# Patient Record
Sex: Female | Born: 1945 | Race: White | Hispanic: No | Marital: Married | Smoking: Never smoker
Health system: Southern US, Community
[De-identification: ages and names within clinical notes are randomized; demographics above are authoritative.]

## PROBLEM LIST (undated history)

## (undated) DIAGNOSIS — I10 Essential (primary) hypertension: Secondary | ICD-10-CM

## (undated) DIAGNOSIS — E079 Disorder of thyroid, unspecified: Secondary | ICD-10-CM

## (undated) DIAGNOSIS — E785 Hyperlipidemia, unspecified: Secondary | ICD-10-CM

## (undated) DIAGNOSIS — K5792 Diverticulitis of intestine, part unspecified, without perforation or abscess without bleeding: Secondary | ICD-10-CM

## (undated) HISTORY — DX: Essential (primary) hypertension: I10

## (undated) HISTORY — PX: OTHER SURGICAL HISTORY: SHX169

## (undated) HISTORY — PX: COLONOSCOPY: SHX174

## (undated) HISTORY — DX: Disorder of thyroid, unspecified: E07.9

## (undated) HISTORY — DX: Diverticulitis of intestine, part unspecified, without perforation or abscess without bleeding: K57.92

## (undated) HISTORY — DX: Hyperlipidemia, unspecified: E78.5

## (undated) HISTORY — PX: DILATION AND CURETTAGE OF UTERUS: SHX78

---

## 1998-03-17 ENCOUNTER — Other Ambulatory Visit: Admission: RE | Admit: 1998-03-17 | Discharge: 1998-03-17 | Payer: Self-pay | Admitting: Obstetrics and Gynecology

## 1998-11-14 ENCOUNTER — Other Ambulatory Visit: Admission: RE | Admit: 1998-11-14 | Discharge: 1998-11-14 | Payer: Self-pay | Admitting: Obstetrics and Gynecology

## 1999-01-25 ENCOUNTER — Encounter: Payer: Self-pay | Admitting: Obstetrics and Gynecology

## 1999-01-25 ENCOUNTER — Ambulatory Visit (HOSPITAL_COMMUNITY): Admission: RE | Admit: 1999-01-25 | Discharge: 1999-01-25 | Payer: Self-pay | Admitting: Obstetrics and Gynecology

## 1999-10-13 ENCOUNTER — Ambulatory Visit (HOSPITAL_COMMUNITY): Admission: RE | Admit: 1999-10-13 | Discharge: 1999-10-13 | Payer: Self-pay | Admitting: Family Medicine

## 1999-10-13 ENCOUNTER — Encounter: Payer: Self-pay | Admitting: Family Medicine

## 1999-11-28 ENCOUNTER — Encounter: Payer: Self-pay | Admitting: Orthopedic Surgery

## 1999-11-28 ENCOUNTER — Encounter: Admission: RE | Admit: 1999-11-28 | Discharge: 1999-11-28 | Payer: Self-pay | Admitting: Orthopedic Surgery

## 1999-12-05 ENCOUNTER — Other Ambulatory Visit: Admission: RE | Admit: 1999-12-05 | Discharge: 1999-12-05 | Payer: Self-pay | Admitting: Obstetrics and Gynecology

## 2000-10-02 ENCOUNTER — Encounter: Admission: RE | Admit: 2000-10-02 | Discharge: 2000-10-02 | Payer: Self-pay | Admitting: Family Medicine

## 2000-10-02 ENCOUNTER — Encounter: Payer: Self-pay | Admitting: Family Medicine

## 2000-10-14 ENCOUNTER — Ambulatory Visit (HOSPITAL_COMMUNITY): Admission: RE | Admit: 2000-10-14 | Discharge: 2000-10-15 | Payer: Self-pay | Admitting: Neurosurgery

## 2000-10-14 ENCOUNTER — Encounter: Payer: Self-pay | Admitting: Neurosurgery

## 2000-10-24 ENCOUNTER — Encounter: Payer: Self-pay | Admitting: Family Medicine

## 2000-10-24 ENCOUNTER — Ambulatory Visit (HOSPITAL_COMMUNITY): Admission: RE | Admit: 2000-10-24 | Discharge: 2000-10-24 | Payer: Self-pay | Admitting: Family Medicine

## 2000-12-03 ENCOUNTER — Encounter: Payer: Self-pay | Admitting: Neurosurgery

## 2000-12-03 ENCOUNTER — Ambulatory Visit (HOSPITAL_COMMUNITY): Admission: RE | Admit: 2000-12-03 | Discharge: 2000-12-03 | Payer: Self-pay | Admitting: Neurosurgery

## 2000-12-09 ENCOUNTER — Other Ambulatory Visit: Admission: RE | Admit: 2000-12-09 | Discharge: 2000-12-09 | Payer: Self-pay | Admitting: Obstetrics and Gynecology

## 2000-12-13 ENCOUNTER — Ambulatory Visit (HOSPITAL_COMMUNITY): Admission: RE | Admit: 2000-12-13 | Discharge: 2000-12-14 | Payer: Self-pay

## 2001-12-23 ENCOUNTER — Other Ambulatory Visit: Admission: RE | Admit: 2001-12-23 | Discharge: 2001-12-23 | Payer: Self-pay | Admitting: Obstetrics and Gynecology

## 2003-02-22 ENCOUNTER — Other Ambulatory Visit: Admission: RE | Admit: 2003-02-22 | Discharge: 2003-02-22 | Payer: Self-pay | Admitting: Obstetrics and Gynecology

## 2003-08-13 ENCOUNTER — Ambulatory Visit (HOSPITAL_COMMUNITY): Admission: RE | Admit: 2003-08-13 | Discharge: 2003-08-13 | Payer: Self-pay | Admitting: Family Medicine

## 2004-07-06 ENCOUNTER — Other Ambulatory Visit: Admission: RE | Admit: 2004-07-06 | Discharge: 2004-07-06 | Payer: Self-pay | Admitting: *Deleted

## 2005-08-31 ENCOUNTER — Other Ambulatory Visit: Admission: RE | Admit: 2005-08-31 | Discharge: 2005-08-31 | Payer: Self-pay | Admitting: Obstetrics and Gynecology

## 2008-12-24 ENCOUNTER — Ambulatory Visit: Payer: Self-pay | Admitting: Cardiology

## 2008-12-24 ENCOUNTER — Encounter: Payer: Self-pay | Admitting: Cardiology

## 2008-12-24 DIAGNOSIS — E782 Mixed hyperlipidemia: Secondary | ICD-10-CM

## 2008-12-24 LAB — CONVERTED CEMR LAB: CRP, High Sensitivity: <1 (ref 0.00–5.00)

## 2010-03-06 ENCOUNTER — Ambulatory Visit (HOSPITAL_COMMUNITY): Admission: RE | Admit: 2010-03-06 | Discharge: 2010-03-07 | Payer: Self-pay | Admitting: Neurosurgery

## 2010-12-18 LAB — CBC
HCT: 37.4 % (ref 36.0–46.0)
Hemoglobin: 12.8 g/dL (ref 12.0–15.0)
MCHC: 34.3 g/dL (ref 30.0–36.0)
MCV: 89.3 fL (ref 78.0–100.0)
Platelets: 211 10*3/uL (ref 150–400)
RBC: 4.19 MIL/uL (ref 3.87–5.11)
RDW: 13.1 % (ref 11.5–15.5)
WBC: 8.8 10*3/uL (ref 4.0–10.5)

## 2010-12-18 LAB — SURGICAL PCR SCREEN
MRSA, PCR: NEGATIVE
Staphylococcus aureus: NEGATIVE

## 2011-02-16 NOTE — Op Note (Signed)
[place]  Patient:    Meghan Yang, Meghan Yang                      MRN: [id number] Proc. Date: 10/14/00 Attending:  Julio Sicks, M.D.                           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  OPERATION/PROCEDURE: C6-7 anterior cervical diskectomy and fusion with allograft.  SURGEON: Julio Sicks, M.D.  ASSISTANT: Donzetta Sprung. Roney Jaffe., M.D.  ANESTHESIA: General endotracheal.  INDICATIONS FOR PROCEDURE: Meghan Yang is a 65 year old female with a history of neck and right upper extremity pain and weakness consistent with right-sided C7 radiculopathy.  She failed conservative management and MRI scan demonstrated a large right-sided C6-7 disk herniation with compression of the right-sided C7 nerve root.  As the patient has failed conservative management we discussed options and she decided to proceed with C6-7 anterior cervical diskectomy and fusion with allograft to hopefully relieve her symptoms.  DESCRIPTION OF PROCEDURE: The patient was taken to the operating room and placed on the operating room table in the supine position and after an adequate level of anesthesia was achieved the patient was positioned supine with her neck slightly extended and placed in Halter traction.  The anterior cervical region was shaved and prepped sterilely and a skin incision made overlying the C6-7 interspace.  This was carried down sharply to the platysma and the platysma was then divided vertically and dissection proceeded along the medial border of the sternocleidomastoid muscle and carotid sheath on the right side.  The trachea and esophagus were mobilized and retracted toward the left.  Prevertebral fascia was stripped on the anterior side and the longus colli muscles were then elevated bilaterally using electrocautery.  A deep self-retaining retractor  was placed and intraoperative x-ray views of the C6-7 level confirmed.  The disk space was incised with a 15 blade in regular fashion and and the space cleaned out with pituitary rongeurs.  Forward and backward angled cartilage curets, Kerrison ronguers, and the high speed drill. All disc was removed along the posterior annulus.  The operating microscope was brought onto the field and used throughout the remainder of the diskectomy.  The remaining aspects of the annulus and osteophytes were removed using the high speed drill and Kerrison rongeurs down to the level of the posterior margin.  The posterior margin was elevated and resected in piecemeal fashion and cleared of underlying thecal sac.  Decompression then proceeded out to the left side at C7 and the C7 nerve root on this side was found to be free of any compression.  Decompression then proceeded out to the right side at the C7 foramen.  Along the way a large amount of subligamentous disk herniation was encountered and this was completely resected.  Decompression then proceeded distally to the right side of C7 foramen.  The C7 nerve root was identified and found to be well decompressed.  There was no evidence of any residual disk herniation.  The wound was then copiously irrigated with antibiotic solution and Gelfoam was placed for hemostasis.  Hemostasis was shown to be good.  The disk space was then distracted and a 6 mm fusion allograft impacted into place, recessed approximately 2 mm from the anterior cortical surface.  The retractor system and the operative microscope were removed.  Hemostasis  was achieved with bipolar electrocautery.  Final x-ray images revealed good position of the bone graft at the proper operative level with normal alignment of the spine.  The wound was inspected for hemostasis one final time and was found to be good.  The platysma was reapproximated with 3-0 Vicryl suture and the skin reapproximated with 5-0  PDS in inverted subcuticular fashion.  Steri-Strips and a sterile dressing were applied. There were no apparent complications.  The patient tolerated the procedure well and was taken to the recovery room. DD:  10/14/00 TD:  10/15/00 Job: [id number]

## 2011-02-16 NOTE — Op Note (Signed)
[place]  Patient:    Meghan Yang, Meghan Yang                    MRN: [id number] Proc. Date: 12/13/00 Adm. Date:  [id number] Attending:  Gennie Alma CC:         Chales Salmon. Abigail Miyamoto, M.D.   Operative Report  CCS [phone].  PREOPERATIVE DIAGNOSIS:  Mass of right lobe of the thyroid.  POSTOPERATIVE DIAGNOSIS:  Follicular adenomata (two), right lobe of thyroid.  PROCEDURE:  Right thyroid lobectomy and isthmusectomy.  SURGEON:  Milus Mallick, M.D.  ASSISTANT:  Velora Heckler, M.D.  ANESTHESIA:  General endotracheal.  DESCRIPTION OF PROCEDURE:  Under adequate general endotracheal anesthesia, the patients neck was extended over a shoulder roll, and that was actually done prior to instituting the general anesthetic because the patient had had recent cervical spine surgery and we wanted to be certain that she was having no discomfort.  The neck was prepared and draped in the usual fashion.  There was a healing scar of the right side of her neck transversely that was approximately 2-1/2 fingerbreadths above the sternal notch.  An incision was made in the scar on the right side and carried out to the left side in a skin line.  Bleeders were electrocoagulated.  The platysma muscle was divided. Superior and inferior platysmal flaps were then developed using Bovie electrocoagulation.  No cervical fascia was exposed.  A Mahorner self-retaining retractor was inserted.  The middle cervical fascia was incised longitudinally in the midline.  The right-sided strap muscles were dissected off of the right lobe of the thyroid.  There was a large dominant mass in the central to upper lobe.  By blunt and sharp dissection, the right lobe was separated from the carotid sheath and surrounding tissue.  The right superior pole vessels were exposed.  They were controlled with a ligature of 2-0 silk and a clip on the stay side and a ligature of 2-0 silk on the  specimen side. Next, the inferior pole vessels were divided over hemostats, which were ligated with 3-0 silk.  The right inferior parathyroid gland was identified and spared of any injury in this dissection.  The right lobe of the thyroid was then easier to retract medially and rotate anteriorly.  Branches of the inferior thyroid artery were taken over small hemoclips, and care was taken not to injure the right recurrent laryngeal nerve.  The ligament of Allyson Sabal was divided over a medium clip.  The attachments of the lobe to the trachea were then divided with Bovie electrocoagulation, and the specimen was elevated out of the right side of the neck and was attached only by the isthmus.  The isthmus was divided in its junction with the left lobe of the thyroid over two hemostats, which were secured with 4-0 Vicryl suture ligatures.  The specimen was sent for frozen section study, which suggested that there were two nodules in the lobe that were follicular lesions but had a benign appearance.  Hemostasis was ascertained.  The strap muscles were repaired in the midline with a continuous suture of 3-0 Vicryl.  The platysma layer was reapproximated with interrupted sutures of 4-0 Vicryl.  The skin was approximated with a generic skin stapler, 35-W, and a sterile dressing was applied.  Estimated blood loss for the procedure was negligible.  The patient tolerated the procedure well and left the operating room in satisfactory condition. DD:  12/13/00 TD:  12/13/00  Job: [id number]

## 2011-03-27 ENCOUNTER — Encounter: Payer: Self-pay | Admitting: Cardiology

## 2015-03-02 DEATH — deceased

## 2016-04-11 ENCOUNTER — Encounter: Payer: Self-pay | Admitting: Gastroenterology

## 2016-06-05 ENCOUNTER — Encounter (INDEPENDENT_AMBULATORY_CARE_PROVIDER_SITE_OTHER): Payer: Self-pay

## 2016-06-05 ENCOUNTER — Ambulatory Visit (AMBULATORY_SURGERY_CENTER): Payer: Medicare Other | Admitting: *Deleted

## 2016-06-05 VITALS — Ht 59.0 in | Wt 157.0 lb

## 2016-06-05 DIAGNOSIS — Z1211 Encounter for screening for malignant neoplasm of colon: Secondary | ICD-10-CM

## 2016-06-05 MED ORDER — NA SULFATE-K SULFATE-MG SULF 17.5-3.13-1.6 GM/177ML PO SOLN: 1.0000 | Freq: Once | ORAL | 0 refills | Status: AC

## 2016-06-05 NOTE — Progress Notes (Signed)
No egg or soy allergy known to patient  No issues with past sedation with any surgeries  or procedures, no intubation problems - 30 yrs ago with d and c was over sedated and had burning sensation in chest but no issues since with sedation  No diet pills per patient No home 02 use per patient  No blood thinners per patient  Pt denies issues with constipation - takes metamucil daily with regularity - goes daily and softer  No A fib or A flutter

## 2016-06-06 ENCOUNTER — Encounter: Payer: Self-pay | Admitting: Gastroenterology

## 2016-06-11 ENCOUNTER — Other Ambulatory Visit: Payer: Self-pay | Admitting: Internal Medicine

## 2016-06-11 ENCOUNTER — Ambulatory Visit
Admission: RE | Admit: 2016-06-11 | Discharge: 2016-06-11 | Disposition: A | Discharge status: Home / Self Care | Payer: Medicare Other | Source: Ambulatory Visit | Attending: Internal Medicine | Admitting: Internal Medicine

## 2016-06-11 DIAGNOSIS — R10814 Left lower quadrant abdominal tenderness: Secondary | ICD-10-CM

## 2016-06-11 DIAGNOSIS — R1011 Right upper quadrant pain: Secondary | ICD-10-CM

## 2016-06-11 MED ORDER — IOPAMIDOL (ISOVUE-300) INJECTION 61%
100.0000 mL | Freq: Once | INTRAVENOUS | Status: AC | PRN
  Administered 2016-06-11: 100 mL via INTRAVENOUS

## 2016-06-12 ENCOUNTER — Telehealth: Payer: Self-pay | Admitting: Gastroenterology

## 2016-06-12 NOTE — Telephone Encounter (Signed)
She has small bowel acute diverticulitis which is very rare, will need to complete the course of antibiotics. Please arrange for patient to follow up in 2 weeks, ok to schedule with APP and we should obtain CT enterography with contrast in 4- 6 weeks to exclude any underlying pathology

## 2016-06-12 NOTE — Telephone Encounter (Signed)
The CT report is in EPIC. Her PCP called her. She is on Flagyl and Cipro. The PCP told her the CT was "interesting" and he would like her to be seen soon. I can put her with an APP if you feel it is urgent. She rescheduled her colonoscopy to November. It is a screening colon. Please advise on office appointment.

## 2016-06-13 NOTE — Telephone Encounter (Signed)
Left a message on her voicemail about this and offering an appointment with the doctor on 06/28/16. Her husband has colon cancer and has to be at [facility] today. When I spoke with her yesterday, she had asked me to leave her a message.

## 2016-06-13 NOTE — Telephone Encounter (Signed)
Patient calls back. She has spoken with Dr Joylene Draft. She thinks she has swelling on her left side of the abdomen. Appointment made for 06/21/16. Patient states Dr Joylene Draft wants her seen in a week. She doesn't want to wait until 06/28/16.

## 2016-06-18 ENCOUNTER — Encounter: Payer: Self-pay | Admitting: Gastroenterology

## 2016-06-21 ENCOUNTER — Ambulatory Visit (INDEPENDENT_AMBULATORY_CARE_PROVIDER_SITE_OTHER): Payer: Medicare Other | Admitting: Gastroenterology

## 2016-06-21 ENCOUNTER — Encounter (INDEPENDENT_AMBULATORY_CARE_PROVIDER_SITE_OTHER): Payer: Self-pay

## 2016-06-21 ENCOUNTER — Other Ambulatory Visit (INDEPENDENT_AMBULATORY_CARE_PROVIDER_SITE_OTHER): Payer: Medicare Other

## 2016-06-21 ENCOUNTER — Encounter: Payer: Self-pay | Admitting: Gastroenterology

## 2016-06-21 VITALS — BP 118/74 | HR 92 | Ht 59.0 in | Wt 154.6 lb

## 2016-06-21 DIAGNOSIS — R140 Abdominal distension (gaseous): Secondary | ICD-10-CM | POA: Diagnosis not present

## 2016-06-21 DIAGNOSIS — K5792 Diverticulitis of intestine, part unspecified, without perforation or abscess without bleeding: Secondary | ICD-10-CM | POA: Diagnosis not present

## 2016-06-21 DIAGNOSIS — R110 Nausea: Secondary | ICD-10-CM | POA: Diagnosis not present

## 2016-06-21 DIAGNOSIS — K5732 Diverticulitis of large intestine without perforation or abscess without bleeding: Secondary | ICD-10-CM

## 2016-06-21 DIAGNOSIS — IMO0002 Reserved for concepts with insufficient information to code with codable children: Secondary | ICD-10-CM

## 2016-06-21 LAB — BASIC METABOLIC PANEL
BUN: 11 mg/dL (ref 6–23)
CO2: 32 mEq/L (ref 19–32)
Calcium: 9 mg/dL (ref 8.4–10.5)
Chloride: 106 mEq/L (ref 96–112)
Creatinine, Ser: 0.7 mg/dL (ref 0.40–1.20)
GFR: 87.66 mL/min (ref >60.00)
Glucose, Bld: 80 mg/dL (ref 70–99)
Potassium: 4.3 mEq/L (ref 3.5–5.1)
Sodium: 142 mEq/L (ref 135–145)

## 2016-06-21 MED ORDER — ONDANSETRON HCL 4 MG PO TABS: 4.0000 mg | ORAL_TABLET | Freq: Two times a day (BID) | ORAL | 0 refills | Status: AC

## 2016-06-21 NOTE — Patient Instructions (Addendum)
You have been scheduled for a CT scan of the abdomen and pelvis at [facility] ([address]---this is in the same building as [facility]).   You are scheduled on 07/23/2016 at 11am arrive at 9:30am. To drink contrast  WARNING: IF YOU ARE ALLERGIC TO IODINE/X-RAY DYE, PLEASE NOTIFY RADIOLOGY IMMEDIATELY AT [phone]! YOU WILL BE GIVEN A 13 HOUR PREMEDICATION PREP.  1) Do not eat or drink anything after 7:00am (4 hours prior to your test) 2) You have been given 2 bottles of oral contrast to drink. The solution may taste               better if refrigerated, but do NOT add ice or any other liquid to this solution. Shake             well before drinking.    You may take any medications as prescribed with a small amount of water except for the following: Metformin, Glucophage, Glucovance, Avandamet, Riomet, Fortamet, Actoplus Met, Janumet, Glumetza or Metaglip. The above medications must be held the day of the exam AND 48 hours after the exam.  The purpose of you drinking the oral contrast is to aid in the visualization of your intestinal tract. The contrast solution may cause some diarrhea. Before your exam is started, you will be given a small amount of fluid to drink. Depending on your individual set of symptoms, you may also receive an intravenous injection of x-ray contrast/dye. Plan on being at [facility] for 30 minutes or longer, depending on the type of exam you are having performed.  This test typically takes 30-45 minutes to complete.  If you have any questions regarding your exam or if you need to reschedule, you may call the CT department at [phone] between the hours of 8:00 am and 5:00 pm, Monday-Friday.  ________________________________________________________________________   We have sent Zofran to your pharmacy  Go to the basement today for labs  (BMET)  Use VSL # 3 units daily as needed purchased over the counter at your pharmacy  Take  Benefiber daily three times a day with meals  Use over the counter Miralax 1/2 capful daily then reduce as needed   Use Candy Ginger as needed for nausea

## 2016-06-21 NOTE — Progress Notes (Signed)
Meghan Yang    CF:[id number]    March 17, 1946  Primary Care Physician:PERINI,MARK A, MD  Referring Physician: Crist Infante, MD [address] [place], [place] [address]  Chief complaint:  Acute small bowel diverticulitis  HPI: 70 year old female here for visit after recent diagnosis of acute small bowel diverticulitis 10 days ago, she developed severe abdominal pain in the mid abdomen on the right side associated with fever and chills, Dr. Joylene Draft obtained CT abdomen and pelvis that showed evidence of small bowel diverticulitis of 3 cm diverticula just off the proximal small bowel, likely proximal to mid jejunal loop. She is on Cipro and Flagyl date 12/14. Abdominal pain has significantly improved and she is slowly trying to advance her diet .She is having severe nausea with antibiotics and decreased appetite . She has history of chronic intermittent constipation that has improved with daily Metamucil but complains of increased bloating in the upper abdomen. Denies any  vomiting,  melena or bright red blood per rectum    Outpatient Encounter Prescriptions as of 06/21/2016  Medication Sig  . atorvastatin (LIPITOR) 10 MG tablet Take 10 mg by mouth daily.  . Calcium Carbonate-Vitamin D (CALTRATE 600+D) 600-400 MG-UNIT per tablet Take 1 tablet by mouth daily.    . ciprofloxacin (CIPRO) 500 MG tablet   . glucosamine-chondroitin 500-400 MG tablet Take 1 tablet by mouth daily.  Marland Kitchen levothyroxine (SYNTHROID, LEVOTHROID) 75 MCG tablet Take 75 mcg by mouth daily.    . metroNIDAZOLE (FLAGYL) 500 MG tablet   . Omega-3 Fatty Acids (FISH OIL) 1200 MG CAPS Take 1 capsule by mouth daily.    Marland Kitchen omeprazole (PRILOSEC) 20 MG capsule Take 20 mg by mouth daily.  Marland Kitchen OVER THE COUNTER MEDICATION Take 2 capsules by mouth daily. Metamucil  . Probiotic Product ([place]) CAPS Take by mouth.  . triamcinolone (NASACORT AQ) 55 MCG/ACT nasal inhaler Place 2 sprays into the nose daily.    . valsartan  (DIOVAN) 80 MG tablet Take 80 mg by mouth daily.  . ondansetron (ZOFRAN) 4 MG tablet Take 1 tablet (4 mg total) by mouth 2 (two) times daily. As needed  . [DISCONTINUED] eszopiclone (LUNESTA) 2 MG TABS Take 2 mg by mouth at bedtime as needed. Take immediately before bedtime   . [DISCONTINUED] ranitidine (ZANTAC) 150 MG tablet Take 150 mg by mouth 2 (two) times daily.   No facility-administered encounter medications on file as of 06/21/2016.     Allergies as of 06/21/2016 - Review Complete 06/21/2016  Allergen Reaction Noted  . Penicillins Rash 12/24/2008    Past Medical History:  Diagnosis Date  . Diverticulitis   . HLD (hyperlipidemia)   . Hypertension   . Thyroid disease    right thyroid mass removed- benign    Past Surgical History:  Procedure Laterality Date  . COLONOSCOPY    . DILATION AND CURETTAGE OF UTERUS     30 yrs ago  . mass of right lobe on thyroid     postoperative diagonosis: follicular adenoata (two), right lobe of thyroid. 12/13/00  . right C6-7 herniated nucleus pilposus     10/14/00    Family History  Problem Relation Age of Onset  . Coronary artery disease Father     and sibilings   . Colon cancer Neg Hx   . Colon polyps Neg Hx   . Rectal cancer Neg Hx   . Stomach cancer Neg Hx     Social History  Social History  . Marital status: Married    Spouse name: N/A  . Number of children: N/A  . Years of education: N/A   Occupational History  . Not on file.   Social History Main Topics  . Smoking status: Never Smoker  . Smokeless tobacco: Never Used     Comment: tobacco use - no   . Alcohol use Yes     Comment: very rare   . Drug use: No  . Sexual activity: Not on file   Other Topics Concern  . Not on file   Social History Narrative   Retired, married, gets regular exercise.       Review of systems: Review of Systems  Constitutional: Negative for fever and chills.  HENT: Negative.   Eyes: Negative for blurred vision.  Respiratory:  Negative for cough, shortness of breath and wheezing.   Cardiovascular: Negative for chest pain and palpitations.  Gastrointestinal: as per HPI Genitourinary: Negative for dysuria, urgency, frequency and hematuria.  Musculoskeletal: Negative for myalgias, back pain and joint pain.  Skin: Negative for itching and rash.  Neurological: Negative for dizziness, tremors, focal weakness, seizures and loss of consciousness.  Endo/Heme/Allergies: Positive for seasonal allergies.  Psychiatric/Behavioral: Negative for depression, suicidal ideas and hallucinations.  All other systems reviewed and are negative.   Physical Exam: Vitals:   06/21/16 1020  BP: 118/74  Pulse: 92   Body mass index is 31.23 kg/m. Gen:      No acute distress HEENT:  EOMI, sclera anicteric Neck:     No masses; no thyromegaly Lungs:    Clear to auscultation bilaterally; normal respiratory effort CV:         Regular rate and rhythm; no murmurs Abd:      + bowel sounds; soft, non-tender; no palpable masses, no distension Ext:    No edema; adequate peripheral perfusion Skin:      Warm and dry; no rash Neuro: alert and oriented x 3 Psych: normal mood and affect  Data Reviewed: CT abdomen and pelvis with contrast 06/11/16 3 cm diverticulum off the proximal small bowel loop in the left abdomen with surrounding hazy inflammation compatible with acute small bowel diverticulitis.   Assessment and Plan/Recommendations:  70 year old female here for office visit after recent episode of acute small bowel diverticulitis, likely for diverticula in the proximal/mid jejunum, I reviewed the images of CT abdomen and pelvis, it may be in a region that may not be reached by push enteroscopy. Will obtain CT enterography with contrast to evaluate in 4-6 weeks to exclude any mass or obstructive lesion in the proximity of the diverticula  She is due for screening colonoscopy scheduled in November 2017 The risks and benefits as well as  alternatives of endoscopic procedure(s) have been discussed and reviewed. All questions answered. The patient agrees to proceed.  Chronic constipation: Start MiraLAX half capful daily and titrate as needed to have one to 2 soft bowel movements a day Benefiber 1 tablespoon 3 times a day with meals  Excessive bloating: Likely related to use of Metamucil and excessive fiber intake Advised patient to avoid excessive fiber  Start Probiotic VSL # 3 112 Billion Units daily after completion of her antibiotic course  Severe nausea in the setting of antibiotic use: Occasionally use Zofran as needed and can also try candy Ginger   45 minutes was spent face-to-face with the patient. Greater than 50% of the time used for counseling as well as treatment plan and follow-up. She had  multiple questions which were answered to her satisfaction  K. Denzil Magnuson , MD [phone] Mon-Fri 8a-5p [phone] after 5p, weekends, holidays  CC: Crist Infante, MD

## 2016-06-27 ENCOUNTER — Other Ambulatory Visit: Payer: Medicare Other

## 2016-07-23 ENCOUNTER — Ambulatory Visit (INDEPENDENT_AMBULATORY_CARE_PROVIDER_SITE_OTHER)
Admission: RE | Admit: 2016-07-23 | Discharge: 2016-07-23 | Disposition: A | Discharge status: Home / Self Care | Payer: Medicare Other | Source: Ambulatory Visit | Attending: Gastroenterology | Admitting: Gastroenterology

## 2016-07-23 DIAGNOSIS — IMO0002 Reserved for concepts with insufficient information to code with codable children: Secondary | ICD-10-CM

## 2016-07-23 DIAGNOSIS — K5792 Diverticulitis of intestine, part unspecified, without perforation or abscess without bleeding: Secondary | ICD-10-CM | POA: Diagnosis not present

## 2016-07-23 MED ORDER — IOPAMIDOL (ISOVUE-300) INJECTION 61%
100.0000 mL | Freq: Once | INTRAVENOUS | Status: AC | PRN
  Administered 2016-07-23: 100 mL via INTRAVENOUS

## 2016-07-26 ENCOUNTER — Telehealth: Payer: Self-pay | Admitting: Gastroenterology

## 2016-07-26 NOTE — Telephone Encounter (Signed)
Ok to go back to [employer], if she prefers it

## 2016-07-26 NOTE — Telephone Encounter (Signed)
Patient is pleased with her CT results and remains pain free. She is experiencing sluggish bowel movements. She feels a little constipated and attributes this to Benefiber. She thinks it is less effective than Metamucil. Is it okay to go back to using Metamucil instead of Miralax? Or increase Miralax?

## 2016-07-26 NOTE — Telephone Encounter (Signed)
Left message with the recommendation

## 2016-08-14 ENCOUNTER — Encounter: Payer: Self-pay | Admitting: Gastroenterology

## 2016-08-14 ENCOUNTER — Ambulatory Visit (AMBULATORY_SURGERY_CENTER): Payer: Medicare Other | Admitting: Gastroenterology

## 2016-08-14 VITALS — BP 129/65 | HR 65 | Temp 96.2°F | Resp 30 | Ht 59.0 in | Wt 154.0 lb

## 2016-08-14 DIAGNOSIS — Z1211 Encounter for screening for malignant neoplasm of colon: Secondary | ICD-10-CM

## 2016-08-14 DIAGNOSIS — Z1212 Encounter for screening for malignant neoplasm of rectum: Secondary | ICD-10-CM

## 2016-08-14 DIAGNOSIS — K5792 Diverticulitis of intestine, part unspecified, without perforation or abscess without bleeding: Secondary | ICD-10-CM

## 2016-08-14 MED ORDER — SODIUM CHLORIDE 0.9 % IV SOLN
500.0000 mL | INTRAVENOUS | Status: AC
Start: 2016-08-14 — End: ?

## 2016-08-14 NOTE — Patient Instructions (Signed)
YOU HAD AN ENDOSCOPIC PROCEDURE TODAY AT [facility]:   Refer to the procedure report that was given to you for any specific questions about what was found during the examination.  If the procedure report does not answer your questions, please call your gastroenterologist to clarify.  If you requested that your care partner not be given the details of your procedure findings, then the procedure report has been included in a sealed envelope for you to review at your convenience later.  YOU SHOULD EXPECT: Some feelings of bloating in the abdomen. Passage of more gas than usual.  Walking can help get rid of the air that was put into your GI tract during the procedure and reduce the bloating. If you had a lower endoscopy (such as a colonoscopy or flexible sigmoidoscopy) you may notice spotting of blood in your stool or on the toilet paper. If you underwent a bowel prep for your procedure, you may not have a normal bowel movement for a few days.  Please Note:  You might notice some irritation and congestion in your nose or some drainage.  This is from the oxygen used during your procedure.  There is no need for concern and it should clear up in a day or so.  SYMPTOMS TO REPORT IMMEDIATELY:   Following lower endoscopy (colonoscopy or flexible sigmoidoscopy):  Excessive amounts of blood in the stool  Significant tenderness or worsening of abdominal pains  Swelling of the abdomen that is new, acute  Fever of 100F or higher   Following upper endoscopy (EGD)  Vomiting of blood or coffee ground material  New chest pain or pain under the shoulder blades  Painful or persistently difficult swallowing  New shortness of breath  Fever of 100F or higher  Black, tarry-looking stools  For urgent or emergent issues, a gastroenterologist can be reached at any hour by calling [phone].   DIET:  We do recommend a small meal at first, but then you may proceed to your regular diet.  Drink  plenty of fluids but you should avoid alcoholic beverages for 24 hours.  ACTIVITY:  You should plan to take it easy for the rest of today and you should NOT DRIVE or use heavy machinery until tomorrow (because of the sedation medicines used during the test).    FOLLOW UP: Our staff will call the number listed on your records the next business day following your procedure to check on you and address any questions or concerns that you may have regarding the information given to you following your procedure. If we do not reach you, we will leave a message.  However, if you are feeling well and you are not experiencing any problems, there is no need to return our call.  We will assume that you have returned to your regular daily activities without incident.  If any biopsies were taken you will be contacted by phone or by letter within the next 1-3 weeks.  Please call us at [phone] if you have not heard about the biopsies in 3 weeks.    SIGNATURES/CONFIDENTIALITY: You and/or your care partner have signed paperwork which will be entered into your electronic medical record.  These signatures attest to the fact that that the information above on your After Visit Summary has been reviewed and is understood.  Full responsibility of the confidentiality of this discharge information lies with you and/or your care-partner.  Hemorrhoid and diverticulosis information given.

## 2016-08-14 NOTE — Progress Notes (Signed)
Spontaneous respirations throughout. VSS. Resting comfortably. To PACU on room air. Report to  Jane RN. 

## 2016-08-14 NOTE — Op Note (Addendum)
[facility] Patient Name: Meghan Yang Procedure Date: 08/14/2016 8:56 AM MRN: [id number] Endoscopist: Mauri Pole , MD Age: 70 Referring MD:  Date of Birth: 09/22/1946 Gender: Female Account #: [id number] Procedure:                Colonoscopy Indications:              Screening for colorectal malignant neoplasm Medicines:                Monitored Anesthesia Care Procedure:                Pre-Anesthesia Assessment:                           - Prior to the procedure, a History and Physical                            was performed, and patient medications and                            allergies were reviewed. The patient's tolerance of                            previous anesthesia was also reviewed. The risks                            and benefits of the procedure and the sedation                            options and risks were discussed with the patient.                            All questions were answered, and informed consent                            was obtained. Prior Anticoagulants: The patient has                            taken no previous anticoagulant or antiplatelet                            agents. ASA Grade Assessment: II - A patient with                            mild systemic disease. After reviewing the risks                            and benefits, the patient was deemed in                            satisfactory condition to undergo the procedure.                           After obtaining informed consent, the colonoscope  was passed under direct vision. Throughout the                            procedure, the patient's blood pressure, pulse, and                            oxygen saturations were monitored continuously. The                            Model CF-HQ190L [id number]) scope was introduced                            through the anus and advanced to the the cecum,                            identified  by appendiceal orifice and ileocecal                            valve. The colonoscopy was performed without                            difficulty. The patient tolerated the procedure                            well. The quality of the bowel preparation was                            excellent. The ileocecal valve, appendiceal                            orifice, and rectum were photographed. The quality                            of the bowel preparation was evaluated using the                            BBPS [facility] Bowel Preparation Scale) with scores                            of: Right Colon = 3, Transverse Colon = 3 and Left                            Colon = 3 (entire mucosa seen well with no residual                            staining, small fragments of stool or opaque                            liquid). The total BBPS score equals 9. Scope In: 9:22:21 AM Scope Out: 9:35:55 AM Scope Withdrawal Time: 0 hours 10 minutes 8 seconds  Total Procedure Duration: 0 hours 13 minutes 34 seconds  Findings:  The perianal and digital rectal examinations were                            normal.                           A few small and large-mouthed diverticula were                            found in the sigmoid colon.                           Non-bleeding internal hemorrhoids were found during                            retroflexion. The hemorrhoids were medium-sized.                           The exam was otherwise without abnormality. Complications:            No immediate complications. Estimated Blood Loss:     Estimated blood loss: none. Impression:               - Diverticulosis in the sigmoid colon.                           - Non-bleeding internal hemorrhoids.                           - The examination was otherwise normal.                           - No specimens collected. Recommendation:           - Patient has a contact number available for                             emergencies. The signs and symptoms of potential                            delayed complications were discussed with the                            patient. Return to normal activities tomorrow.                            Written discharge instructions were provided to the                            patient.                           - Resume previous diet.                           - Continue present medications.                           -  Repeat colonoscopy in 10 years for screening                            purposes.                           - Return to GI clinic PRN. Mauri Pole, MD 08/14/2016 9:39:28 AM This report has been signed electronically.

## 2016-08-15 ENCOUNTER — Telehealth: Payer: Self-pay

## 2016-08-15 NOTE — Telephone Encounter (Signed)
  Follow up Call-  Call back number 08/14/2016  Post procedure Call Back phone  # [phone]  Permission to leave phone message Yes  Some recent data might be hidden     Patient questions:  Do you have a fever, pain , or abdominal swelling? No. Pain Score  0 *  Have you tolerated food without any problems? Yes.    Have you been able to return to your normal activities? Yes.    Do you have any questions about your discharge instructions: Diet   No. Medications  No. Follow up visit  No.  Do you have questions or concerns about your Care? No.  Actions: * If pain score is 4 or above: No action needed, pain <4.

## 2018-03-11 ENCOUNTER — Other Ambulatory Visit: Payer: Self-pay | Admitting: Obstetrics and Gynecology

## 2018-03-11 DIAGNOSIS — R928 Other abnormal and inconclusive findings on diagnostic imaging of breast: Secondary | ICD-10-CM

## 2018-03-18 ENCOUNTER — Ambulatory Visit
Admission: RE | Admit: 2018-03-18 | Discharge: 2018-03-18 | Disposition: A | Discharge status: Home / Self Care | Payer: Medicare Other | Source: Ambulatory Visit | Attending: Obstetrics and Gynecology | Admitting: Obstetrics and Gynecology

## 2018-03-18 ENCOUNTER — Ambulatory Visit: Payer: Medicare Other

## 2018-03-18 DIAGNOSIS — R928 Other abnormal and inconclusive findings on diagnostic imaging of breast: Secondary | ICD-10-CM

## 2018-06-26 ENCOUNTER — Other Ambulatory Visit: Payer: Self-pay | Admitting: Internal Medicine

## 2018-06-26 DIAGNOSIS — E785 Hyperlipidemia, unspecified: Secondary | ICD-10-CM

## 2018-07-03 ENCOUNTER — Ambulatory Visit
Admission: RE | Admit: 2018-07-03 | Discharge: 2018-07-03 | Disposition: A | Discharge status: Home / Self Care | Payer: Medicare Other | Source: Ambulatory Visit | Attending: Internal Medicine | Admitting: Internal Medicine

## 2018-07-03 DIAGNOSIS — E785 Hyperlipidemia, unspecified: Secondary | ICD-10-CM

## 2019-06-17 IMAGING — CT CT HEART SCORING
3 series · 13 of 20 positions shown, 15 images · non-contrast
Comparison: None.

EXAM:
CT HEART FOR CALCIUM SCORING
TECHNIQUE: CT heart was performed on a 256 channel system using prospective ECG
gating. A scout and noncontrast exam (for calcium scoring) were
performed. Note that this exam targets the heart and the chest was
not imaged in its entirety.

[Series 2: calcium scoring 2.00 qr36 bestdiast 69% · axial · 0.38mm/px · z∈[+1612,+1668]mm · 3 of 70 slices shown]
[im 14/70  vessel]
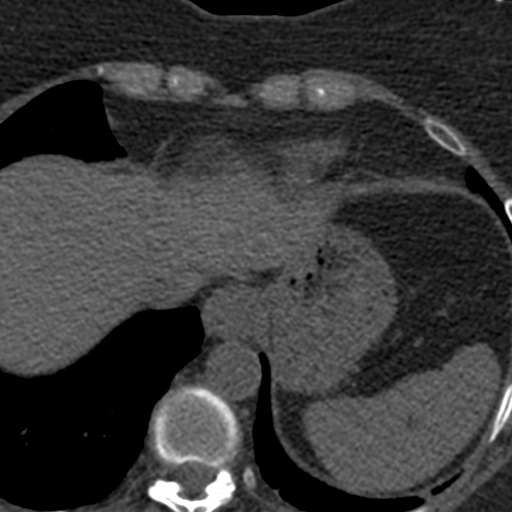
[im 28/70  vessel]
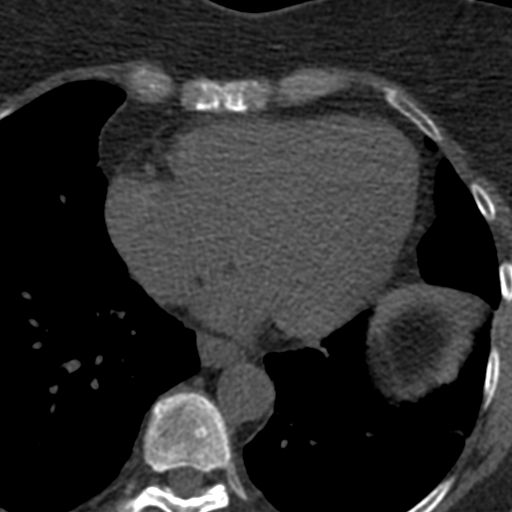
[im 42/70  vessel]
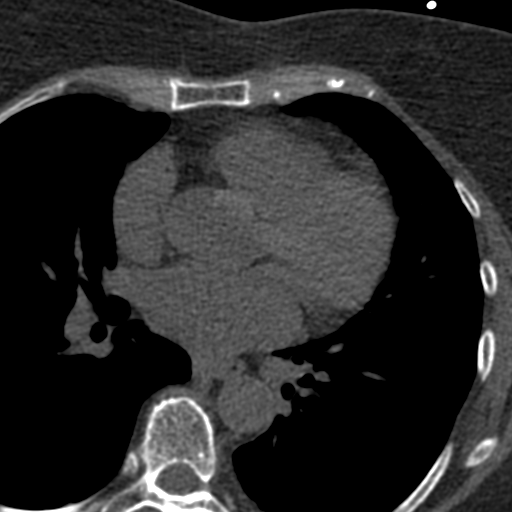

[Series 3: calcium scoring 2.00 br40 bestdiast 69% ax fov · axial · 0.52mm/px · z∈[+1608,+1700]mm · 5 of 70 slices shown, 7 images]
[im 12/70  vessel]
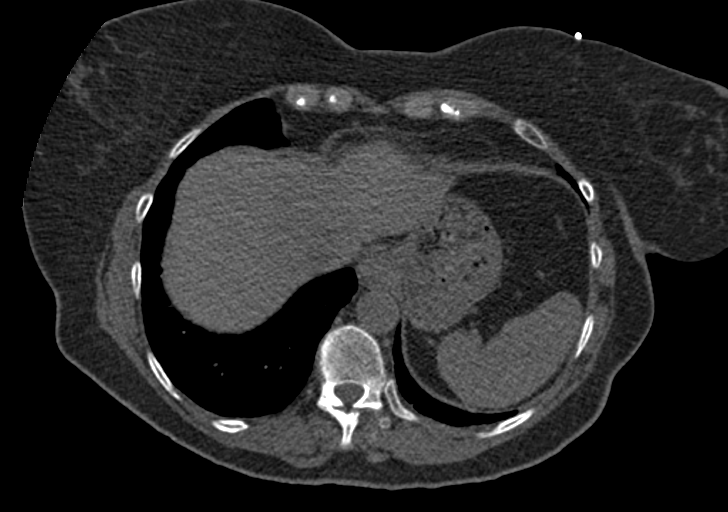
[im 12/70  lung]
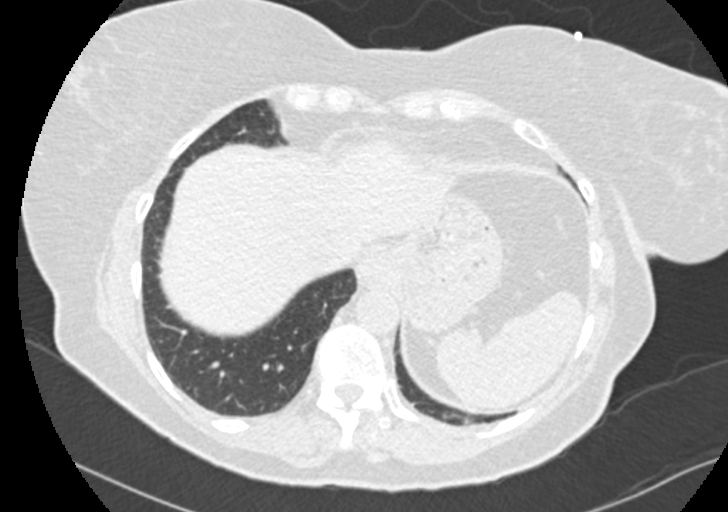
[im 24/70  vessel]
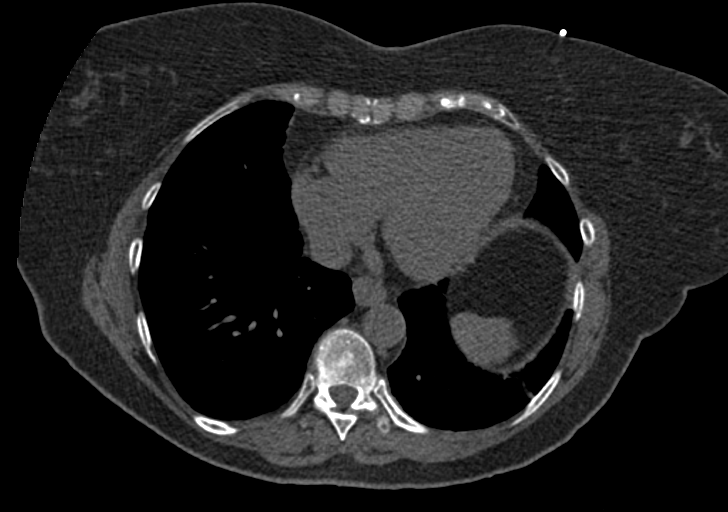
[im 35/70  vessel]
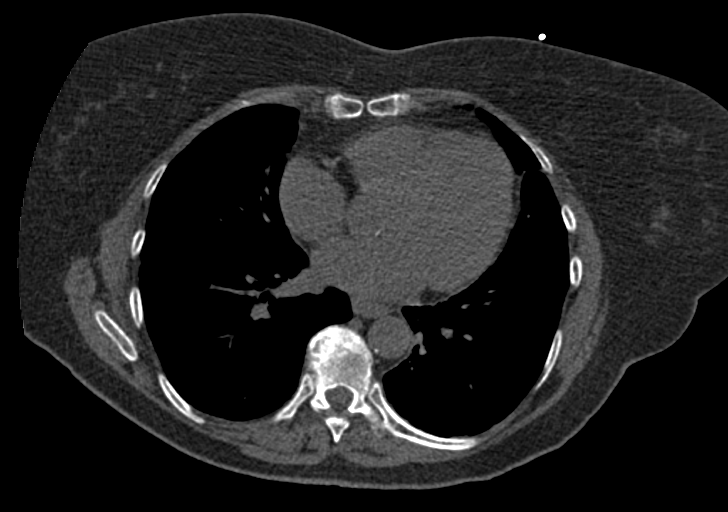
[im 47/70  vessel]
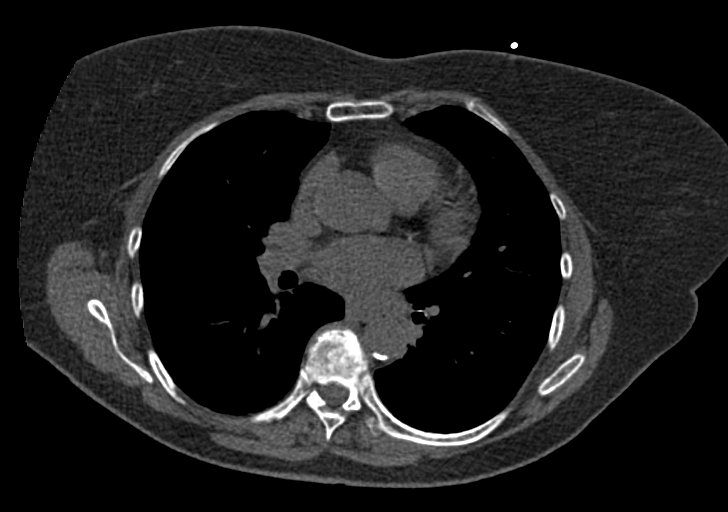
[im 58/70  vessel]
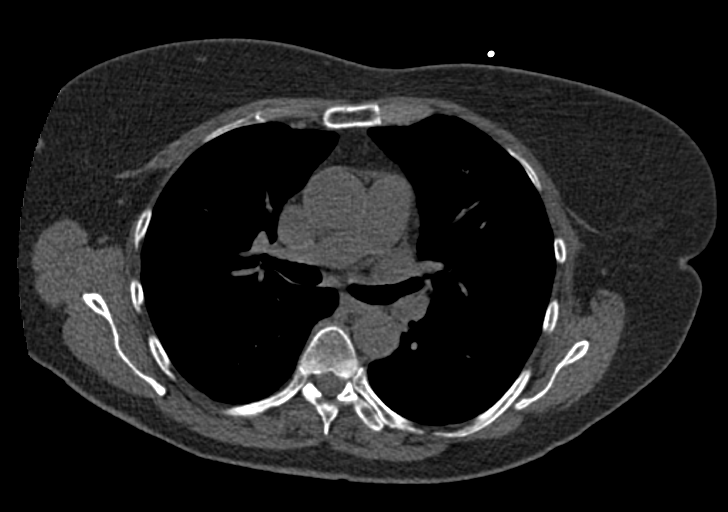
[im 58/70  lung]
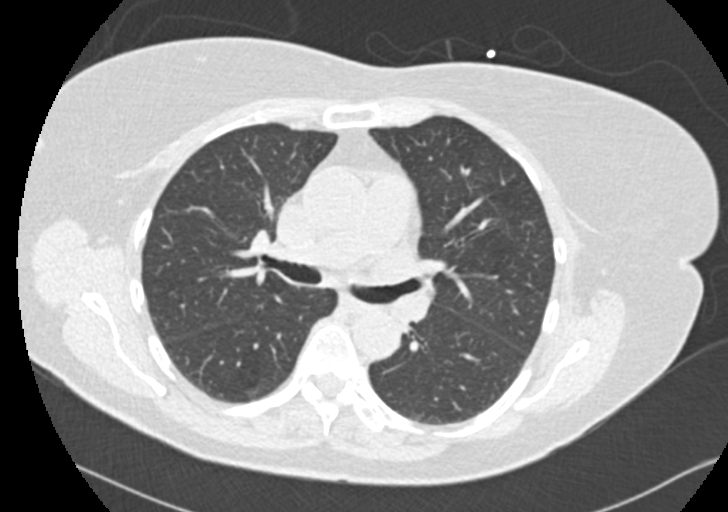

[Series 9: calcium scoring 2.00 br60 bestdiast 69% ax fov · axial · 0.52mm/px · z∈[+1608,+1700]mm · 5 of 70 slices shown]
[im 12/70  vessel]
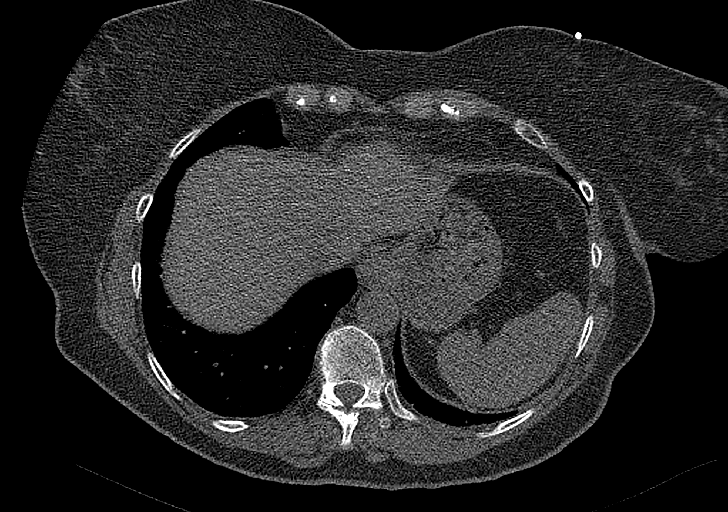
[im 24/70  vessel]
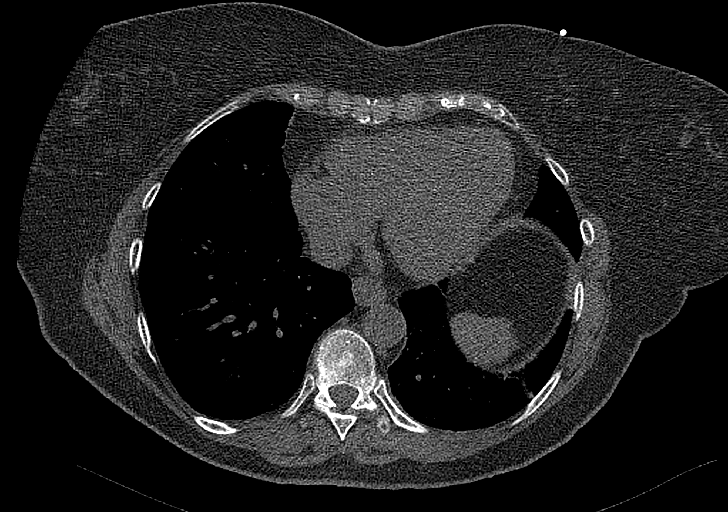
[im 35/70  vessel]
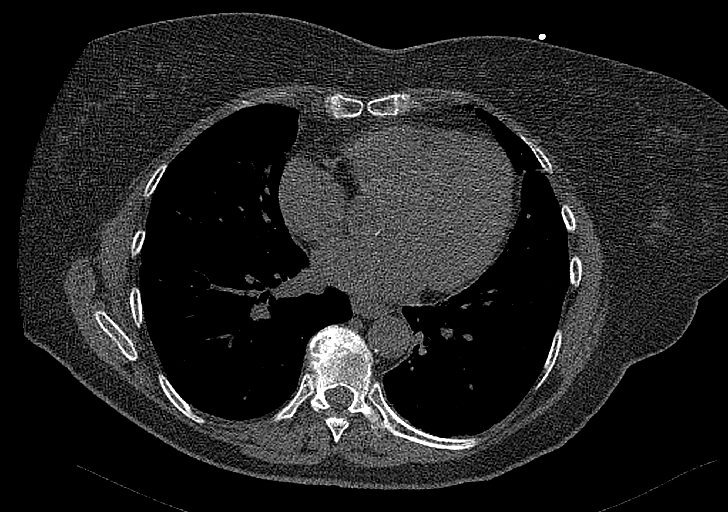
[im 47/70  vessel]
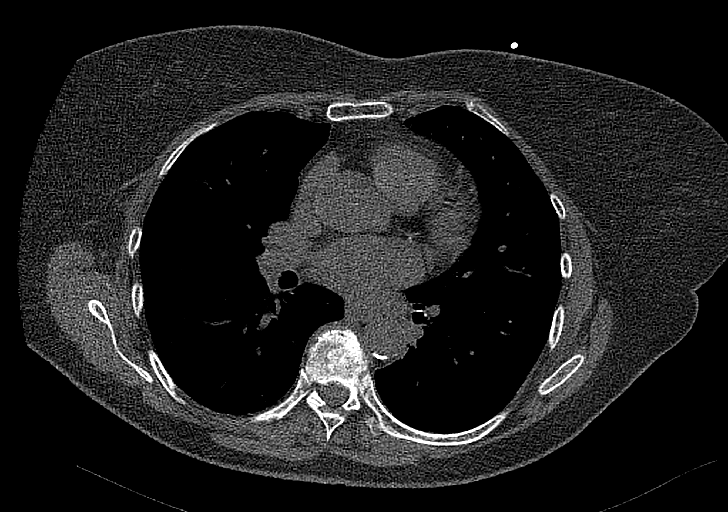
[im 58/70  vessel]
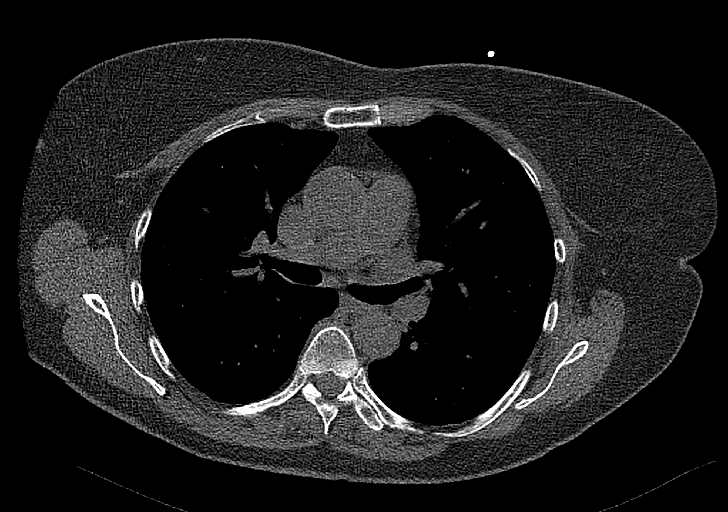

[13 of 20 positions shown; findings below may reference images not displayed]

FINDINGS: Technical quality: Good

LEFT anterior descending and RIGHT coronary artery calcifications

CORONARY CALCIUM

Total Agatston Score: 256

[HOSPITAL] percentile: 80 th

Ascending aorta ( <  40 mm): 31 mm

EXTRACARDIAC FINDINGS:

Limited view of the lung parenchyma demonstrates no suspicious
nodularity. Airways are normal.

Limited view of the mediastinum demonstrates no adenopathy.
Esophagus normal.

Limited view of the upper abdomen unremarkable.

Limited view of the skeleton and chest wall is unremarkable.
IMPRESSION: 1. LAD and RCA coronary artery calcification.

2. Total Agatston Score: 256

3. MESA age and sex matched database percentile: 80th

## 2019-12-12 ENCOUNTER — Ambulatory Visit: Payer: Medicare Other | Attending: Internal Medicine

## 2019-12-12 DIAGNOSIS — Z23 Encounter for immunization: Secondary | ICD-10-CM

## 2019-12-12 NOTE — Progress Notes (Signed)
   Covid-19 Vaccination Clinic  Name:  Meghan Yang    MRN: [id number] DOB: [birth year]  12/12/2019  Meghan Yang was observed post Covid-19 immunization for 15 minutes without incident. She was provided with Vaccine Information Sheet and instruction to access the V-Safe system.   Meghan Yang was instructed to call 911 with any severe reactions post vaccine: Marland Kitchen Difficulty breathing  . Swelling of face and throat  . A fast heartbeat  . A bad rash all over body  . Dizziness and weakness   Immunizations Administered    Name Date Dose VIS Date Route   Pfizer COVID-19 Vaccine 12/12/2019 11:24 AM 0.3 mL 09/11/2019 Intramuscular   Manufacturer: [place]   Lot: [id number]   [place]: [id number]

## 2020-01-05 ENCOUNTER — Ambulatory Visit: Payer: Medicare Other | Attending: Internal Medicine

## 2020-01-05 DIAGNOSIS — Z23 Encounter for immunization: Secondary | ICD-10-CM

## 2020-01-05 NOTE — Progress Notes (Signed)
   Covid-19 Vaccination Clinic  Name:  Meghan Yang    MRN: [id number] DOB: Dec 09, 1945  01/05/2020  Ms. Meghan Yang was observed post Covid-19 immunization for 15 minutes without incident. She was provided with Vaccine Information Sheet and instruction to access the V-Safe system.   Ms. Meghan Yang was instructed to call 911 with any severe reactions post vaccine: Marland Kitchen Difficulty breathing  . Swelling of face and throat  . A fast heartbeat  . A bad rash all over body  . Dizziness and weakness   Immunizations Administered    Name Date Dose VIS Date Route   Pfizer COVID-19 Vaccine 01/05/2020 11:10 AM 0.3 mL 09/11/2019 Intramuscular   Manufacturer: [employer], [employer]   Lot: [id number]   [place]: [id number]

## 2021-10-14 DIAGNOSIS — J029 Acute pharyngitis, unspecified: Secondary | ICD-10-CM | POA: Diagnosis not present

## 2021-10-14 DIAGNOSIS — R0981 Nasal congestion: Secondary | ICD-10-CM | POA: Diagnosis not present

## 2021-10-14 DIAGNOSIS — R051 Acute cough: Secondary | ICD-10-CM | POA: Diagnosis not present

## 2021-10-14 DIAGNOSIS — U071 COVID-19: Secondary | ICD-10-CM | POA: Diagnosis not present

## 2021-10-14 DIAGNOSIS — M7918 Myalgia, other site: Secondary | ICD-10-CM | POA: Diagnosis not present

## 2021-10-14 DIAGNOSIS — Z20822 Contact with and (suspected) exposure to covid-19: Secondary | ICD-10-CM | POA: Diagnosis not present

## 2021-11-02 DIAGNOSIS — E039 Hypothyroidism, unspecified: Secondary | ICD-10-CM | POA: Diagnosis not present

## 2021-11-02 DIAGNOSIS — E785 Hyperlipidemia, unspecified: Secondary | ICD-10-CM | POA: Diagnosis not present

## 2021-11-02 DIAGNOSIS — E559 Vitamin D deficiency, unspecified: Secondary | ICD-10-CM | POA: Diagnosis not present

## 2021-11-02 DIAGNOSIS — I10 Essential (primary) hypertension: Secondary | ICD-10-CM | POA: Diagnosis not present

## 2021-11-06 DIAGNOSIS — Z23 Encounter for immunization: Secondary | ICD-10-CM | POA: Diagnosis not present

## 2021-11-06 DIAGNOSIS — Z1339 Encounter for screening examination for other mental health and behavioral disorders: Secondary | ICD-10-CM | POA: Diagnosis not present

## 2021-11-06 DIAGNOSIS — D229 Melanocytic nevi, unspecified: Secondary | ICD-10-CM | POA: Diagnosis not present

## 2021-11-06 DIAGNOSIS — Z0000 Encounter for general adult medical examination without abnormal findings: Secondary | ICD-10-CM | POA: Diagnosis not present

## 2021-11-06 DIAGNOSIS — E785 Hyperlipidemia, unspecified: Secondary | ICD-10-CM | POA: Diagnosis not present

## 2021-11-06 DIAGNOSIS — E559 Vitamin D deficiency, unspecified: Secondary | ICD-10-CM | POA: Diagnosis not present

## 2021-11-06 DIAGNOSIS — I2510 Atherosclerotic heart disease of native coronary artery without angina pectoris: Secondary | ICD-10-CM | POA: Diagnosis not present

## 2021-11-06 DIAGNOSIS — K5792 Diverticulitis of intestine, part unspecified, without perforation or abscess without bleeding: Secondary | ICD-10-CM | POA: Diagnosis not present

## 2021-11-06 DIAGNOSIS — M25512 Pain in left shoulder: Secondary | ICD-10-CM | POA: Diagnosis not present

## 2021-11-06 DIAGNOSIS — I10 Essential (primary) hypertension: Secondary | ICD-10-CM | POA: Diagnosis not present

## 2021-11-06 DIAGNOSIS — Z79899 Other long term (current) drug therapy: Secondary | ICD-10-CM | POA: Diagnosis not present

## 2021-11-06 DIAGNOSIS — M8580 Other specified disorders of bone density and structure, unspecified site: Secondary | ICD-10-CM | POA: Diagnosis not present

## 2021-11-06 DIAGNOSIS — K219 Gastro-esophageal reflux disease without esophagitis: Secondary | ICD-10-CM | POA: Diagnosis not present

## 2021-11-06 DIAGNOSIS — R82998 Other abnormal findings in urine: Secondary | ICD-10-CM | POA: Diagnosis not present

## 2021-11-06 DIAGNOSIS — Z1331 Encounter for screening for depression: Secondary | ICD-10-CM | POA: Diagnosis not present

## 2022-02-16 DIAGNOSIS — H903 Sensorineural hearing loss, bilateral: Secondary | ICD-10-CM | POA: Diagnosis not present

## 2022-03-13 DIAGNOSIS — H903 Sensorineural hearing loss, bilateral: Secondary | ICD-10-CM | POA: Diagnosis not present

## 2022-05-07 DIAGNOSIS — H43813 Vitreous degeneration, bilateral: Secondary | ICD-10-CM | POA: Diagnosis not present

## 2022-08-16 DIAGNOSIS — Z1231 Encounter for screening mammogram for malignant neoplasm of breast: Secondary | ICD-10-CM | POA: Diagnosis not present

## 2022-08-16 DIAGNOSIS — Z124 Encounter for screening for malignant neoplasm of cervix: Secondary | ICD-10-CM | POA: Diagnosis not present

## 2022-08-16 DIAGNOSIS — E039 Hypothyroidism, unspecified: Secondary | ICD-10-CM | POA: Diagnosis not present

## 2022-08-16 DIAGNOSIS — Z6831 Body mass index (BMI) 31.0-31.9, adult: Secondary | ICD-10-CM | POA: Diagnosis not present

## 2022-08-16 DIAGNOSIS — Z13820 Encounter for screening for osteoporosis: Secondary | ICD-10-CM | POA: Diagnosis not present

## 2022-08-16 DIAGNOSIS — N958 Other specified menopausal and perimenopausal disorders: Secondary | ICD-10-CM | POA: Diagnosis not present

## 2022-08-16 DIAGNOSIS — Z8262 Family history of osteoporosis: Secondary | ICD-10-CM | POA: Diagnosis not present

## 2022-10-04 DIAGNOSIS — R058 Other specified cough: Secondary | ICD-10-CM | POA: Diagnosis not present

## 2022-10-04 DIAGNOSIS — J208 Acute bronchitis due to other specified organisms: Secondary | ICD-10-CM | POA: Diagnosis not present

## 2022-10-04 DIAGNOSIS — R059 Cough, unspecified: Secondary | ICD-10-CM | POA: Diagnosis not present

## 2022-11-13 DIAGNOSIS — D225 Melanocytic nevi of trunk: Secondary | ICD-10-CM | POA: Diagnosis not present

## 2022-11-13 DIAGNOSIS — D2271 Melanocytic nevi of right lower limb, including hip: Secondary | ICD-10-CM | POA: Diagnosis not present

## 2022-11-13 DIAGNOSIS — L821 Other seborrheic keratosis: Secondary | ICD-10-CM | POA: Diagnosis not present

## 2022-11-13 DIAGNOSIS — D2272 Melanocytic nevi of left lower limb, including hip: Secondary | ICD-10-CM | POA: Diagnosis not present

## 2022-11-13 DIAGNOSIS — L245 Irritant contact dermatitis due to other chemical products: Secondary | ICD-10-CM | POA: Diagnosis not present

## 2022-11-13 DIAGNOSIS — D224 Melanocytic nevi of scalp and neck: Secondary | ICD-10-CM | POA: Diagnosis not present

## 2022-11-21 DIAGNOSIS — E559 Vitamin D deficiency, unspecified: Secondary | ICD-10-CM | POA: Diagnosis not present

## 2022-11-21 DIAGNOSIS — R7989 Other specified abnormal findings of blood chemistry: Secondary | ICD-10-CM | POA: Diagnosis not present

## 2022-11-21 DIAGNOSIS — I10 Essential (primary) hypertension: Secondary | ICD-10-CM | POA: Diagnosis not present

## 2022-11-21 DIAGNOSIS — E039 Hypothyroidism, unspecified: Secondary | ICD-10-CM | POA: Diagnosis not present

## 2022-11-21 DIAGNOSIS — E785 Hyperlipidemia, unspecified: Secondary | ICD-10-CM | POA: Diagnosis not present

## 2022-11-21 DIAGNOSIS — K219 Gastro-esophageal reflux disease without esophagitis: Secondary | ICD-10-CM | POA: Diagnosis not present

## 2022-11-26 DIAGNOSIS — R82998 Other abnormal findings in urine: Secondary | ICD-10-CM | POA: Diagnosis not present

## 2022-11-26 DIAGNOSIS — J309 Allergic rhinitis, unspecified: Secondary | ICD-10-CM | POA: Diagnosis not present

## 2022-11-26 DIAGNOSIS — Z1331 Encounter for screening for depression: Secondary | ICD-10-CM | POA: Diagnosis not present

## 2022-11-26 DIAGNOSIS — Z1339 Encounter for screening examination for other mental health and behavioral disorders: Secondary | ICD-10-CM | POA: Diagnosis not present

## 2022-11-26 DIAGNOSIS — M1990 Unspecified osteoarthritis, unspecified site: Secondary | ICD-10-CM | POA: Diagnosis not present

## 2022-11-26 DIAGNOSIS — I10 Essential (primary) hypertension: Secondary | ICD-10-CM | POA: Diagnosis not present

## 2022-11-26 DIAGNOSIS — I2510 Atherosclerotic heart disease of native coronary artery without angina pectoris: Secondary | ICD-10-CM | POA: Diagnosis not present

## 2022-11-26 DIAGNOSIS — K219 Gastro-esophageal reflux disease without esophagitis: Secondary | ICD-10-CM | POA: Diagnosis not present

## 2022-11-26 DIAGNOSIS — M8580 Other specified disorders of bone density and structure, unspecified site: Secondary | ICD-10-CM | POA: Diagnosis not present

## 2022-11-26 DIAGNOSIS — Z0000 Encounter for general adult medical examination without abnormal findings: Secondary | ICD-10-CM | POA: Diagnosis not present

## 2022-11-26 DIAGNOSIS — E039 Hypothyroidism, unspecified: Secondary | ICD-10-CM | POA: Diagnosis not present

## 2022-11-26 DIAGNOSIS — F4323 Adjustment disorder with mixed anxiety and depressed mood: Secondary | ICD-10-CM | POA: Diagnosis not present

## 2022-11-26 DIAGNOSIS — E785 Hyperlipidemia, unspecified: Secondary | ICD-10-CM | POA: Diagnosis not present

## 2022-12-14 DIAGNOSIS — J019 Acute sinusitis, unspecified: Secondary | ICD-10-CM | POA: Diagnosis not present

## 2022-12-14 DIAGNOSIS — R051 Acute cough: Secondary | ICD-10-CM | POA: Diagnosis not present

## 2022-12-14 DIAGNOSIS — J40 Bronchitis, not specified as acute or chronic: Secondary | ICD-10-CM | POA: Diagnosis not present

## 2022-12-14 DIAGNOSIS — R5383 Other fatigue: Secondary | ICD-10-CM | POA: Diagnosis not present

## 2022-12-14 DIAGNOSIS — Z1152 Encounter for screening for COVID-19: Secondary | ICD-10-CM | POA: Diagnosis not present

## 2023-03-26 DIAGNOSIS — R062 Wheezing: Secondary | ICD-10-CM | POA: Diagnosis not present

## 2023-03-26 DIAGNOSIS — J209 Acute bronchitis, unspecified: Secondary | ICD-10-CM | POA: Diagnosis not present

## 2023-03-26 DIAGNOSIS — J309 Allergic rhinitis, unspecified: Secondary | ICD-10-CM | POA: Diagnosis not present

## 2023-05-30 DIAGNOSIS — H35362 Drusen (degenerative) of macula, left eye: Secondary | ICD-10-CM | POA: Diagnosis not present

## 2024-01-08 DIAGNOSIS — A609 Anogenital herpesviral infection, unspecified: Secondary | ICD-10-CM | POA: Diagnosis not present

## 2024-01-08 DIAGNOSIS — Z6831 Body mass index (BMI) 31.0-31.9, adult: Secondary | ICD-10-CM | POA: Diagnosis not present

## 2024-01-08 DIAGNOSIS — Z1231 Encounter for screening mammogram for malignant neoplasm of breast: Secondary | ICD-10-CM | POA: Diagnosis not present

## 2024-01-08 DIAGNOSIS — Z01419 Encounter for gynecological examination (general) (routine) without abnormal findings: Secondary | ICD-10-CM | POA: Diagnosis not present

## 2024-02-03 DIAGNOSIS — D485 Neoplasm of uncertain behavior of skin: Secondary | ICD-10-CM | POA: Diagnosis not present

## 2024-02-03 DIAGNOSIS — D2272 Melanocytic nevi of left lower limb, including hip: Secondary | ICD-10-CM | POA: Diagnosis not present

## 2024-02-03 DIAGNOSIS — L304 Erythema intertrigo: Secondary | ICD-10-CM | POA: Diagnosis not present

## 2024-02-03 DIAGNOSIS — L738 Other specified follicular disorders: Secondary | ICD-10-CM | POA: Diagnosis not present

## 2024-02-03 DIAGNOSIS — D225 Melanocytic nevi of trunk: Secondary | ICD-10-CM | POA: Diagnosis not present

## 2024-02-03 DIAGNOSIS — L821 Other seborrheic keratosis: Secondary | ICD-10-CM | POA: Diagnosis not present

## 2024-02-03 DIAGNOSIS — D1801 Hemangioma of skin and subcutaneous tissue: Secondary | ICD-10-CM | POA: Diagnosis not present

## 2024-02-05 DIAGNOSIS — E039 Hypothyroidism, unspecified: Secondary | ICD-10-CM | POA: Diagnosis not present

## 2024-02-05 DIAGNOSIS — M8580 Other specified disorders of bone density and structure, unspecified site: Secondary | ICD-10-CM | POA: Diagnosis not present

## 2024-02-05 DIAGNOSIS — I10 Essential (primary) hypertension: Secondary | ICD-10-CM | POA: Diagnosis not present

## 2024-02-05 DIAGNOSIS — Z1212 Encounter for screening for malignant neoplasm of rectum: Secondary | ICD-10-CM | POA: Diagnosis not present

## 2024-02-05 DIAGNOSIS — E785 Hyperlipidemia, unspecified: Secondary | ICD-10-CM | POA: Diagnosis not present

## 2024-02-12 DIAGNOSIS — I10 Essential (primary) hypertension: Secondary | ICD-10-CM | POA: Diagnosis not present

## 2024-02-12 DIAGNOSIS — Z0000 Encounter for general adult medical examination without abnormal findings: Secondary | ICD-10-CM | POA: Diagnosis not present

## 2024-02-12 DIAGNOSIS — Z5181 Encounter for therapeutic drug level monitoring: Secondary | ICD-10-CM | POA: Diagnosis not present

## 2024-02-12 DIAGNOSIS — R5383 Other fatigue: Secondary | ICD-10-CM | POA: Diagnosis not present

## 2024-02-12 DIAGNOSIS — Z1389 Encounter for screening for other disorder: Secondary | ICD-10-CM | POA: Diagnosis not present

## 2024-02-12 DIAGNOSIS — R945 Abnormal results of liver function studies: Secondary | ICD-10-CM | POA: Diagnosis not present

## 2024-02-12 DIAGNOSIS — I2510 Atherosclerotic heart disease of native coronary artery without angina pectoris: Secondary | ICD-10-CM | POA: Diagnosis not present

## 2024-02-12 DIAGNOSIS — Z1339 Encounter for screening examination for other mental health and behavioral disorders: Secondary | ICD-10-CM | POA: Diagnosis not present

## 2024-02-12 DIAGNOSIS — R82998 Other abnormal findings in urine: Secondary | ICD-10-CM | POA: Diagnosis not present

## 2024-02-12 DIAGNOSIS — E559 Vitamin D deficiency, unspecified: Secondary | ICD-10-CM | POA: Diagnosis not present

## 2024-02-12 DIAGNOSIS — K219 Gastro-esophageal reflux disease without esophagitis: Secondary | ICD-10-CM | POA: Diagnosis not present

## 2024-02-12 DIAGNOSIS — E039 Hypothyroidism, unspecified: Secondary | ICD-10-CM | POA: Diagnosis not present

## 2024-02-12 DIAGNOSIS — Z1331 Encounter for screening for depression: Secondary | ICD-10-CM | POA: Diagnosis not present

## 2024-02-12 DIAGNOSIS — E785 Hyperlipidemia, unspecified: Secondary | ICD-10-CM | POA: Diagnosis not present

## 2024-02-12 DIAGNOSIS — M8580 Other specified disorders of bone density and structure, unspecified site: Secondary | ICD-10-CM | POA: Diagnosis not present

## 2024-02-12 DIAGNOSIS — D229 Melanocytic nevi, unspecified: Secondary | ICD-10-CM | POA: Diagnosis not present

## 2024-02-26 DIAGNOSIS — E538 Deficiency of other specified B group vitamins: Secondary | ICD-10-CM | POA: Diagnosis not present

## 2024-03-12 DIAGNOSIS — I10 Essential (primary) hypertension: Secondary | ICD-10-CM | POA: Diagnosis not present

## 2024-05-18 DIAGNOSIS — L821 Other seborrheic keratosis: Secondary | ICD-10-CM | POA: Diagnosis not present

## 2024-05-18 DIAGNOSIS — D485 Neoplasm of uncertain behavior of skin: Secondary | ICD-10-CM | POA: Diagnosis not present

## 2024-06-19 DIAGNOSIS — I10 Essential (primary) hypertension: Secondary | ICD-10-CM | POA: Diagnosis not present

## 2024-06-19 DIAGNOSIS — M791 Myalgia, unspecified site: Secondary | ICD-10-CM | POA: Diagnosis not present

## 2024-06-19 DIAGNOSIS — M79671 Pain in right foot: Secondary | ICD-10-CM | POA: Diagnosis not present

## 2024-06-19 DIAGNOSIS — E538 Deficiency of other specified B group vitamins: Secondary | ICD-10-CM | POA: Diagnosis not present

## 2024-06-19 DIAGNOSIS — E039 Hypothyroidism, unspecified: Secondary | ICD-10-CM | POA: Diagnosis not present

## 2024-07-01 DIAGNOSIS — I10 Essential (primary) hypertension: Secondary | ICD-10-CM | POA: Diagnosis not present

## 2024-07-01 DIAGNOSIS — M79671 Pain in right foot: Secondary | ICD-10-CM | POA: Diagnosis not present

## 2024-07-01 DIAGNOSIS — R2689 Other abnormalities of gait and mobility: Secondary | ICD-10-CM | POA: Diagnosis not present

## 2024-07-09 DIAGNOSIS — N6019 Diffuse cystic mastopathy of unspecified breast: Secondary | ICD-10-CM | POA: Diagnosis not present

## 2024-07-10 DIAGNOSIS — M7741 Metatarsalgia, right foot: Secondary | ICD-10-CM | POA: Diagnosis not present

## 2024-07-10 DIAGNOSIS — M79671 Pain in right foot: Secondary | ICD-10-CM | POA: Diagnosis not present

## 2024-07-27 DIAGNOSIS — M79671 Pain in right foot: Secondary | ICD-10-CM | POA: Diagnosis not present

## 2024-07-28 DIAGNOSIS — R0981 Nasal congestion: Secondary | ICD-10-CM | POA: Diagnosis not present

## 2024-07-28 DIAGNOSIS — G43909 Migraine, unspecified, not intractable, without status migrainosus: Secondary | ICD-10-CM | POA: Diagnosis not present

## 2024-07-28 DIAGNOSIS — Z1152 Encounter for screening for COVID-19: Secondary | ICD-10-CM | POA: Diagnosis not present

## 2024-07-28 DIAGNOSIS — R5383 Other fatigue: Secondary | ICD-10-CM | POA: Diagnosis not present

## 2024-07-28 DIAGNOSIS — U071 COVID-19: Secondary | ICD-10-CM | POA: Diagnosis not present

## 2024-07-28 DIAGNOSIS — J309 Allergic rhinitis, unspecified: Secondary | ICD-10-CM | POA: Diagnosis not present

## 2024-07-28 DIAGNOSIS — J392 Other diseases of pharynx: Secondary | ICD-10-CM | POA: Diagnosis not present

## 2024-08-13 DIAGNOSIS — M722 Plantar fascial fibromatosis: Secondary | ICD-10-CM | POA: Diagnosis not present

## 2024-08-13 DIAGNOSIS — M7741 Metatarsalgia, right foot: Secondary | ICD-10-CM | POA: Diagnosis not present

## 2024-08-24 DIAGNOSIS — M7741 Metatarsalgia, right foot: Secondary | ICD-10-CM | POA: Diagnosis not present

## 2024-08-24 DIAGNOSIS — M8589 Other specified disorders of bone density and structure, multiple sites: Secondary | ICD-10-CM | POA: Diagnosis not present

## 2024-08-24 DIAGNOSIS — E559 Vitamin D deficiency, unspecified: Secondary | ICD-10-CM | POA: Diagnosis not present

## 2024-08-24 DIAGNOSIS — Z79899 Other long term (current) drug therapy: Secondary | ICD-10-CM | POA: Diagnosis not present

## 2024-08-24 DIAGNOSIS — I10 Essential (primary) hypertension: Secondary | ICD-10-CM | POA: Diagnosis not present

## 2024-08-24 DIAGNOSIS — E785 Hyperlipidemia, unspecified: Secondary | ICD-10-CM | POA: Diagnosis not present

## 2024-08-24 DIAGNOSIS — M8580 Other specified disorders of bone density and structure, unspecified site: Secondary | ICD-10-CM | POA: Diagnosis not present

## 2024-08-24 DIAGNOSIS — K219 Gastro-esophageal reflux disease without esophagitis: Secondary | ICD-10-CM | POA: Diagnosis not present

## 2024-09-01 DIAGNOSIS — M6281 Muscle weakness (generalized): Secondary | ICD-10-CM | POA: Diagnosis not present

## 2024-09-01 DIAGNOSIS — K219 Gastro-esophageal reflux disease without esophagitis: Secondary | ICD-10-CM | POA: Diagnosis not present

## 2024-09-01 DIAGNOSIS — M79671 Pain in right foot: Secondary | ICD-10-CM | POA: Diagnosis not present

## 2024-09-01 DIAGNOSIS — E559 Vitamin D deficiency, unspecified: Secondary | ICD-10-CM | POA: Diagnosis not present

## 2024-09-01 DIAGNOSIS — M8580 Other specified disorders of bone density and structure, unspecified site: Secondary | ICD-10-CM | POA: Diagnosis not present

## 2024-09-01 DIAGNOSIS — I10 Essential (primary) hypertension: Secondary | ICD-10-CM | POA: Diagnosis not present
